# Patient Record
Sex: Female | Born: 1945 | Race: White | Hispanic: No | Marital: Married | Smoking: Former smoker
Health system: Southern US, Community
[De-identification: ages and names within clinical notes are randomized; demographics above are authoritative.]

## PROBLEM LIST (undated history)

## (undated) DIAGNOSIS — I340 Nonrheumatic mitral (valve) insufficiency: Secondary | ICD-10-CM

## (undated) DIAGNOSIS — M545 Low back pain, unspecified: Secondary | ICD-10-CM

## (undated) DIAGNOSIS — E079 Disorder of thyroid, unspecified: Secondary | ICD-10-CM

## (undated) DIAGNOSIS — K219 Gastro-esophageal reflux disease without esophagitis: Secondary | ICD-10-CM

## (undated) DIAGNOSIS — M1990 Unspecified osteoarthritis, unspecified site: Secondary | ICD-10-CM

## (undated) DIAGNOSIS — M419 Scoliosis, unspecified: Secondary | ICD-10-CM

## (undated) HISTORY — DX: Gastro-esophageal reflux disease without esophagitis: K21.9

## (undated) HISTORY — DX: Scoliosis, unspecified: M41.9

## (undated) HISTORY — DX: Unspecified osteoarthritis, unspecified site: M19.90

## (undated) HISTORY — DX: Low back pain: M54.5

## (undated) HISTORY — DX: Nonrheumatic mitral (valve) insufficiency: I34.0

## (undated) HISTORY — PX: ABDOMINAL HYSTERECTOMY: SHX81

## (undated) HISTORY — DX: Disorder of thyroid, unspecified: E07.9

## (undated) HISTORY — DX: Low back pain, unspecified: M54.50

---

## 1997-10-29 ENCOUNTER — Ambulatory Visit (HOSPITAL_COMMUNITY): Admission: RE | Admit: 1997-10-29 | Discharge: 1997-10-29 | Payer: Self-pay

## 1999-03-06 ENCOUNTER — Ambulatory Visit (HOSPITAL_COMMUNITY): Admission: RE | Admit: 1999-03-06 | Discharge: 1999-03-06 | Payer: Self-pay | Admitting: *Deleted

## 1999-03-13 ENCOUNTER — Ambulatory Visit (HOSPITAL_COMMUNITY): Admission: RE | Admit: 1999-03-13 | Discharge: 1999-03-13 | Payer: Self-pay | Admitting: *Deleted

## 1999-10-16 ENCOUNTER — Ambulatory Visit (HOSPITAL_COMMUNITY): Admission: RE | Admit: 1999-10-16 | Discharge: 1999-10-16 | Payer: Self-pay | Admitting: *Deleted

## 2000-10-18 ENCOUNTER — Ambulatory Visit (HOSPITAL_COMMUNITY): Admission: RE | Admit: 2000-10-18 | Discharge: 2000-10-18 | Payer: Self-pay | Admitting: *Deleted

## 2001-10-25 ENCOUNTER — Ambulatory Visit (HOSPITAL_COMMUNITY): Admission: RE | Admit: 2001-10-25 | Discharge: 2001-10-25 | Payer: Self-pay | Admitting: *Deleted

## 2002-08-10 HISTORY — PX: OTHER SURGICAL HISTORY: SHX169

## 2002-10-31 ENCOUNTER — Ambulatory Visit (HOSPITAL_COMMUNITY): Admission: RE | Admit: 2002-10-31 | Discharge: 2002-10-31 | Payer: Self-pay | Admitting: Internal Medicine

## 2002-10-31 ENCOUNTER — Encounter: Payer: Self-pay | Admitting: Internal Medicine

## 2003-02-13 ENCOUNTER — Other Ambulatory Visit: Admission: RE | Admit: 2003-02-13 | Discharge: 2003-02-13 | Payer: Self-pay | Admitting: Internal Medicine

## 2003-02-16 ENCOUNTER — Encounter: Admission: RE | Admit: 2003-02-16 | Discharge: 2003-02-16 | Payer: Self-pay | Admitting: Internal Medicine

## 2003-02-16 ENCOUNTER — Encounter: Payer: Self-pay | Admitting: Internal Medicine

## 2003-11-06 ENCOUNTER — Ambulatory Visit (HOSPITAL_COMMUNITY): Admission: RE | Admit: 2003-11-06 | Discharge: 2003-11-06 | Payer: Self-pay | Admitting: Internal Medicine

## 2004-04-29 ENCOUNTER — Other Ambulatory Visit: Admission: RE | Admit: 2004-04-29 | Discharge: 2004-04-29 | Payer: Self-pay | Admitting: Internal Medicine

## 2004-11-18 ENCOUNTER — Ambulatory Visit (HOSPITAL_COMMUNITY): Admission: RE | Admit: 2004-11-18 | Discharge: 2004-11-18 | Payer: Self-pay | Admitting: Internal Medicine

## 2005-08-06 ENCOUNTER — Encounter: Admission: RE | Admit: 2005-08-06 | Discharge: 2005-08-06 | Payer: Self-pay | Admitting: Internal Medicine

## 2005-08-06 LAB — HM DEXA SCAN

## 2005-08-07 ENCOUNTER — Encounter: Admission: RE | Admit: 2005-08-07 | Discharge: 2005-08-07 | Payer: Self-pay | Admitting: Internal Medicine

## 2005-09-02 ENCOUNTER — Ambulatory Visit: Payer: Self-pay | Admitting: Pulmonary Disease

## 2005-10-27 ENCOUNTER — Ambulatory Visit: Payer: Self-pay | Admitting: Internal Medicine

## 2005-11-18 ENCOUNTER — Ambulatory Visit: Payer: Self-pay | Admitting: Internal Medicine

## 2005-11-18 ENCOUNTER — Ambulatory Visit (HOSPITAL_COMMUNITY): Admission: RE | Admit: 2005-11-18 | Discharge: 2005-11-18 | Payer: Self-pay | Admitting: Internal Medicine

## 2005-11-19 ENCOUNTER — Ambulatory Visit (HOSPITAL_COMMUNITY): Admission: RE | Admit: 2005-11-19 | Discharge: 2005-11-19 | Payer: Self-pay | Admitting: Internal Medicine

## 2006-05-25 ENCOUNTER — Encounter: Admission: RE | Admit: 2006-05-25 | Discharge: 2006-05-25 | Payer: Self-pay | Admitting: Internal Medicine

## 2006-11-23 ENCOUNTER — Ambulatory Visit (HOSPITAL_COMMUNITY): Admission: RE | Admit: 2006-11-23 | Discharge: 2006-11-23 | Payer: Self-pay | Admitting: Internal Medicine

## 2007-06-17 ENCOUNTER — Other Ambulatory Visit: Admission: RE | Admit: 2007-06-17 | Discharge: 2007-06-17 | Payer: Self-pay | Admitting: Internal Medicine

## 2007-06-17 LAB — HM PAP SMEAR

## 2007-06-21 ENCOUNTER — Encounter: Admission: RE | Admit: 2007-06-21 | Discharge: 2007-06-21 | Payer: Self-pay | Admitting: Internal Medicine

## 2007-11-29 ENCOUNTER — Ambulatory Visit (HOSPITAL_COMMUNITY): Admission: RE | Admit: 2007-11-29 | Discharge: 2007-11-29 | Payer: Self-pay | Admitting: Internal Medicine

## 2008-06-22 ENCOUNTER — Ambulatory Visit: Payer: Self-pay | Admitting: Internal Medicine

## 2008-12-05 ENCOUNTER — Ambulatory Visit (HOSPITAL_COMMUNITY): Admission: RE | Admit: 2008-12-05 | Discharge: 2008-12-05 | Payer: Self-pay | Admitting: Internal Medicine

## 2009-03-26 IMAGING — CT CT CHEST W/O CM
3 of 4 series · 17 of 30 positions shown, 19 images · IV contrast (agent unspecified)
Comparison: 05/25/06 CT.

CLINICAL DATA: Followup pulmonary nodules noted on CT scan on 05/25/06. 
 CHEST CT WITHOUT CONTRAST:
TECHNIQUE: Multidetector CT imaging of the chest was performed following the standard protocol without IV contrast.

[Series 2: routine chest · axial · 0.70mm/px · z∈[-277,-67]mm · 5 of 72 slices shown, 7 images]
[im 15/72  mediastinal]
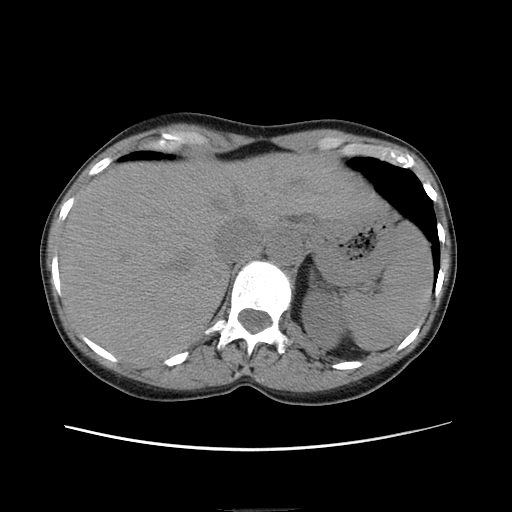
[im 15/72  lung]
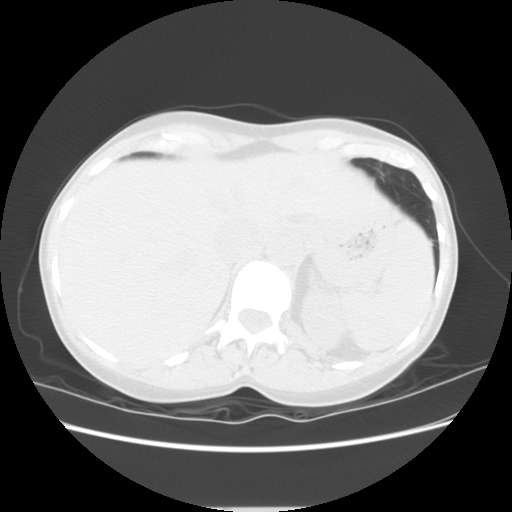
[im 29/72  lung]
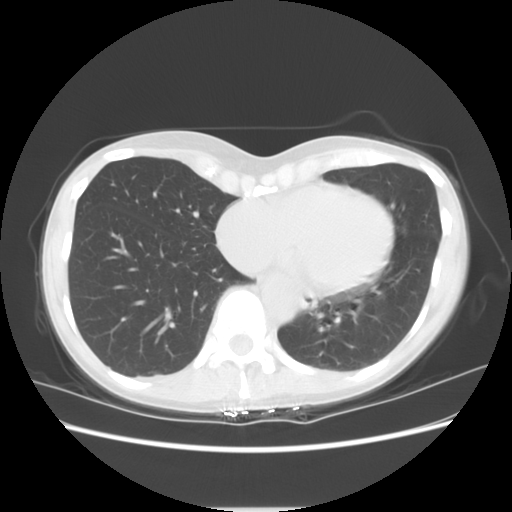
[im 35/72  lung]
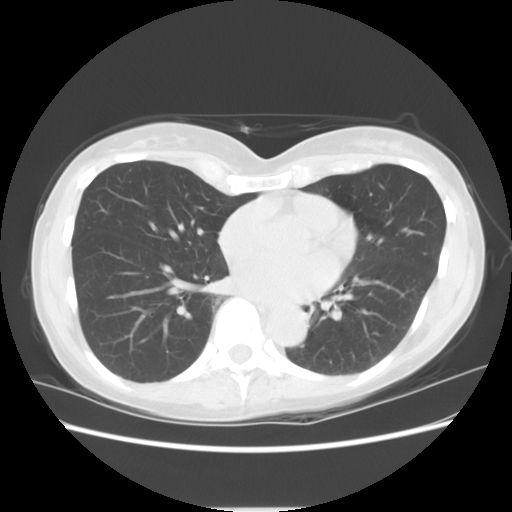
[im 43/72  lung]
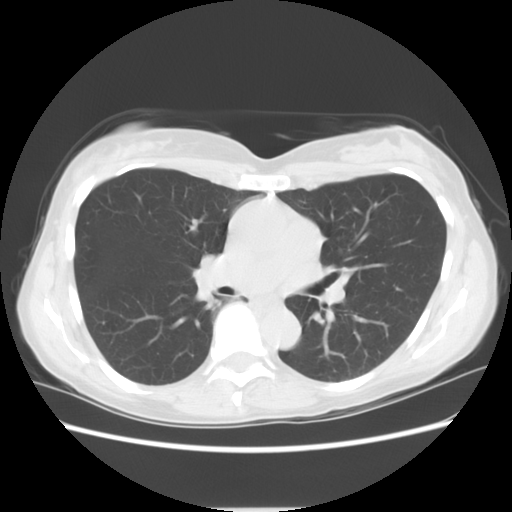
[im 57/72  mediastinal]
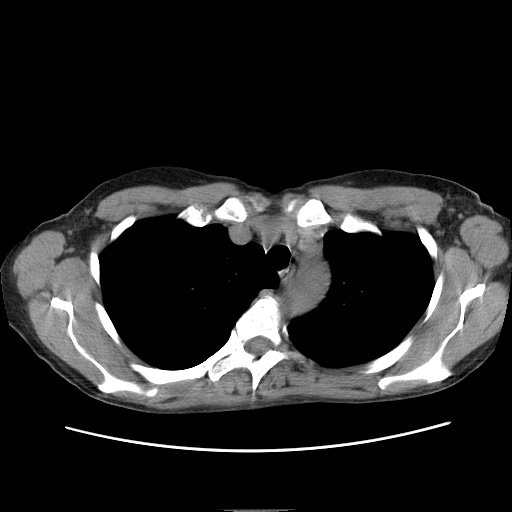
[im 57/72  lung]
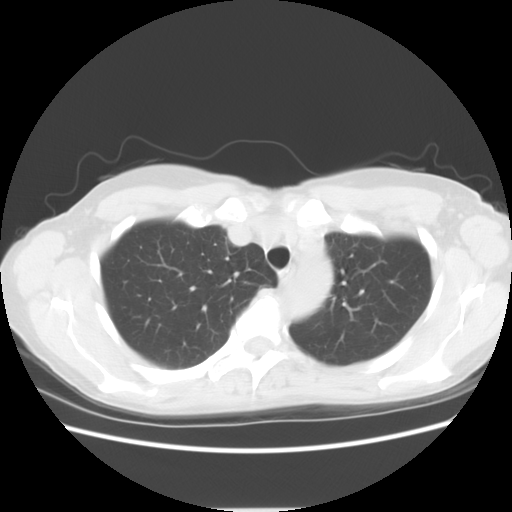

[Series 3: lung windows · axial · 0.70mm/px · z∈[-232,-72]mm · 4 of 64 slices shown]
[im 16/64  lung]
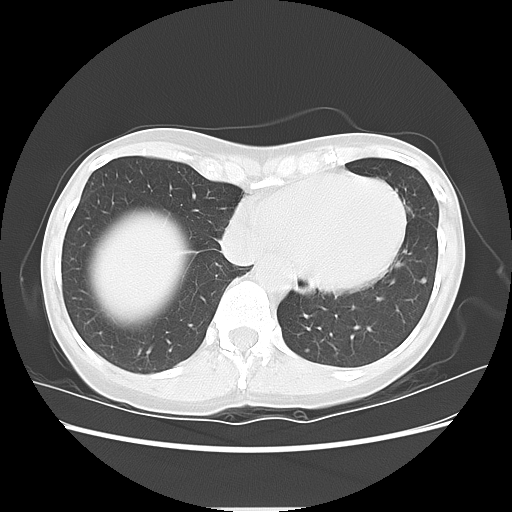
[im 27/64  lung]
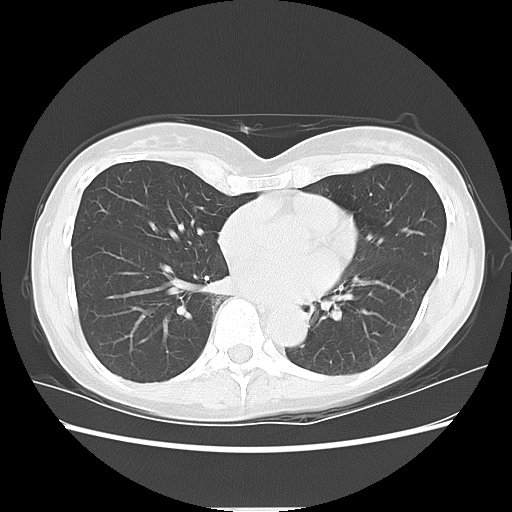
[im 32/64  lung]
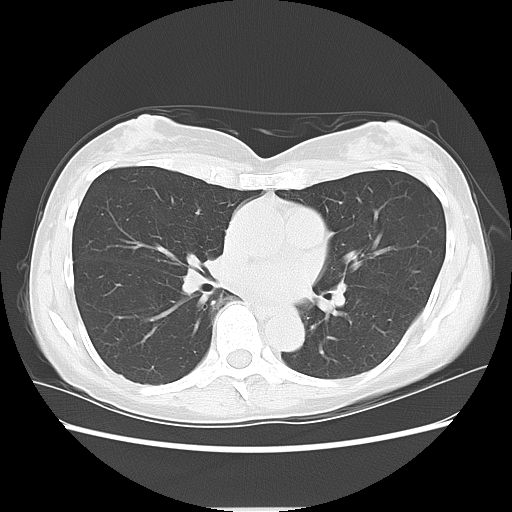
[im 48/64  lung]
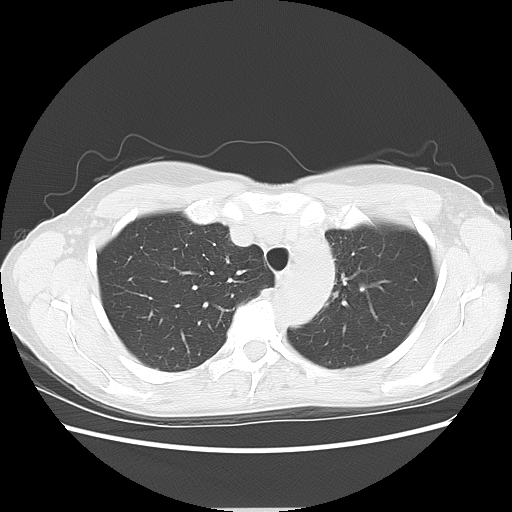

[Series 401: sagittal · sagittal · 0.70mm/px · 8 of 121 slices shown]
[im 14/121  lung]
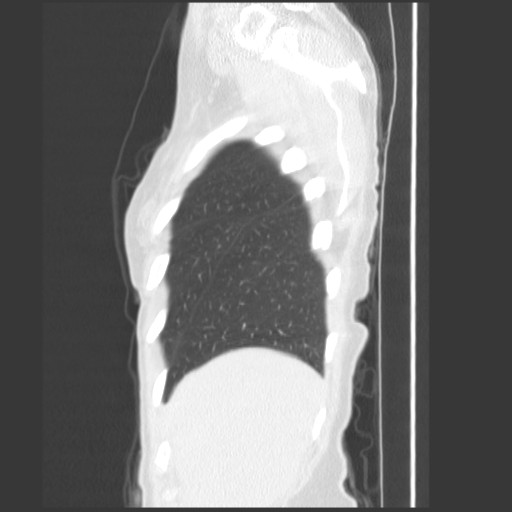
[im 27/121  lung]
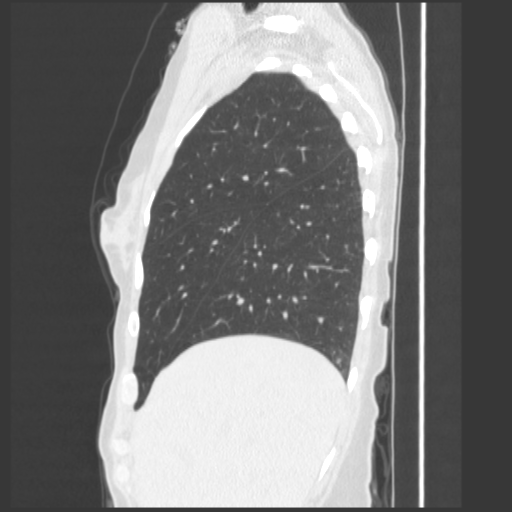
[im 41/121  lung]
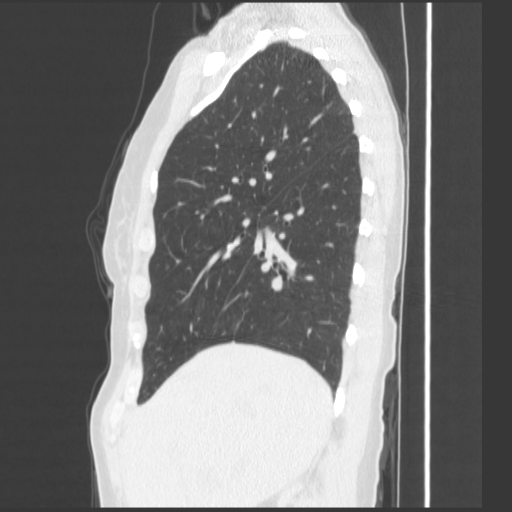
[im 54/121  lung]
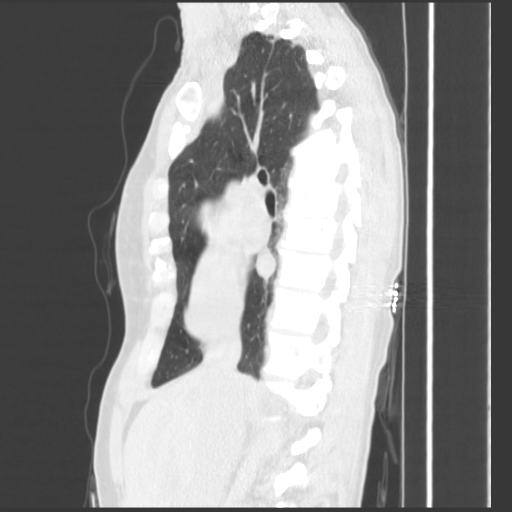
[im 67/121  lung]
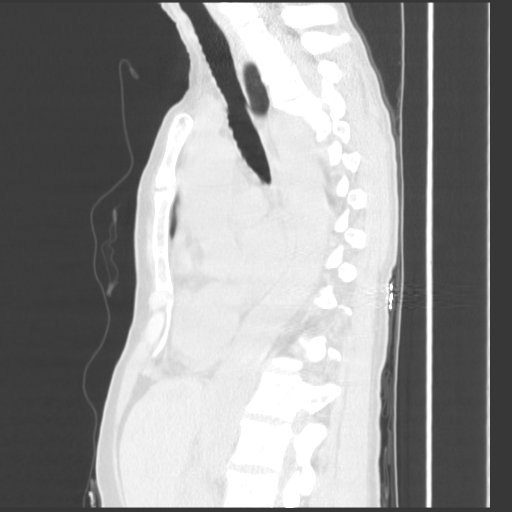
[im 81/121  lung]
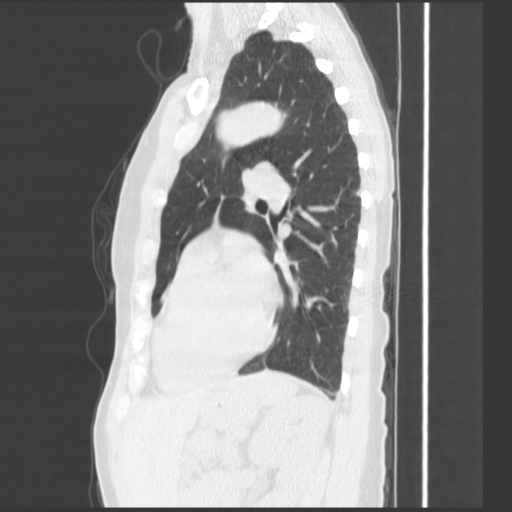
[im 94/121  lung]
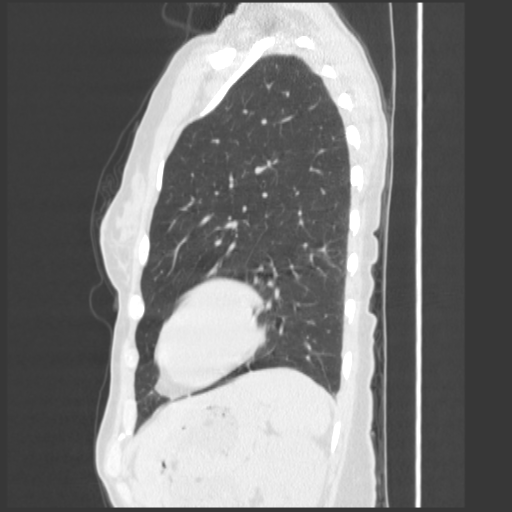
[im 107/121  lung]
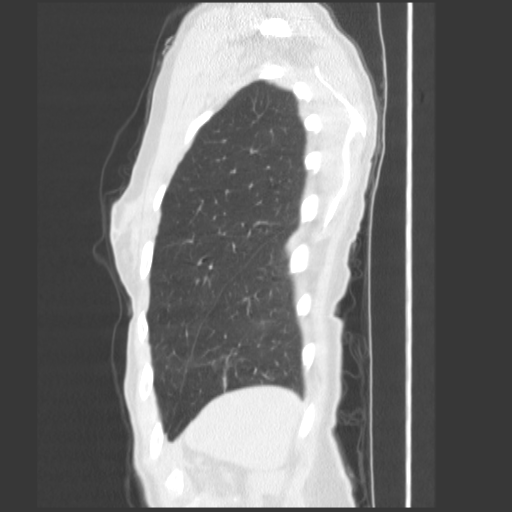

[17 of 30 positions shown; findings below may reference images not displayed]

FINDINGS: Stable noncalcified 4.1 mm nodule in the right upper lobe (image 24) and 4.5 mm nodule in the left lower lobe (image 48).  No enlarged mediastinal or hilar lymph nodes.  Incidental pectus excavatum deformity of the chest.
IMPRESSION: Stable right upper and left lower lobar nodules.  This represents a followup CT at approximately 1 year.  Given the stability of the nodules, I do not feel that further imaging followup is necessary if this is a low risk patient, i.e., minimal or absent history of smoking or other known risk factors.  If this is a high risk patient, i.e., history of smoking or other known risk factors then I would recommend a followup CT at 18-24 months.

## 2009-08-13 ENCOUNTER — Ambulatory Visit: Payer: Self-pay | Admitting: Internal Medicine

## 2009-09-15 ENCOUNTER — Ambulatory Visit: Payer: Self-pay | Admitting: Internal Medicine

## 2009-12-05 ENCOUNTER — Ambulatory Visit (HOSPITAL_COMMUNITY): Admission: RE | Admit: 2009-12-05 | Discharge: 2009-12-05 | Payer: Self-pay | Admitting: Internal Medicine

## 2009-12-05 LAB — HM MAMMOGRAPHY

## 2010-08-21 ENCOUNTER — Ambulatory Visit
Admission: RE | Admit: 2010-08-21 | Discharge: 2010-08-21 | Payer: Self-pay | Source: Home / Self Care | Attending: Internal Medicine | Admitting: Internal Medicine

## 2010-09-18 ENCOUNTER — Other Ambulatory Visit: Payer: Self-pay | Admitting: Internal Medicine

## 2010-12-05 ENCOUNTER — Ambulatory Visit (INDEPENDENT_AMBULATORY_CARE_PROVIDER_SITE_OTHER): Payer: BC Managed Care – PPO | Admitting: Internal Medicine

## 2010-12-05 DIAGNOSIS — E039 Hypothyroidism, unspecified: Secondary | ICD-10-CM

## 2010-12-08 ENCOUNTER — Other Ambulatory Visit: Payer: Self-pay | Admitting: Internal Medicine

## 2010-12-08 DIAGNOSIS — Z1231 Encounter for screening mammogram for malignant neoplasm of breast: Secondary | ICD-10-CM

## 2011-01-08 ENCOUNTER — Ambulatory Visit (HOSPITAL_COMMUNITY)
Admission: RE | Admit: 2011-01-08 | Discharge: 2011-01-08 | Disposition: A | Discharge status: Home / Self Care | Payer: BC Managed Care – PPO | Source: Ambulatory Visit | Attending: Internal Medicine | Admitting: Internal Medicine

## 2011-01-08 DIAGNOSIS — Z1231 Encounter for screening mammogram for malignant neoplasm of breast: Secondary | ICD-10-CM | POA: Insufficient documentation

## 2011-08-18 ENCOUNTER — Other Ambulatory Visit: Payer: BC Managed Care – PPO | Admitting: Internal Medicine

## 2011-08-18 DIAGNOSIS — Z0000 Encounter for general adult medical examination without abnormal findings: Secondary | ICD-10-CM

## 2011-08-18 DIAGNOSIS — E039 Hypothyroidism, unspecified: Secondary | ICD-10-CM

## 2011-08-18 LAB — CBC WITH DIFFERENTIAL/PLATELET
Eosinophils Absolute: 0.2 10*3/uL (ref 0.0–0.7)
Eosinophils Relative: 5 % (ref 0–5)
HCT: 41.2 % (ref 36.0–46.0)
Hemoglobin: 14.3 g/dL (ref 12.0–15.0)
Lymphocytes Relative: 50 % — ABNORMAL HIGH (ref 12–46)
Lymphs Abs: 2 10*3/uL (ref 0.7–4.0)
MCH: 33.6 pg (ref 26.0–34.0)
MCV: 96.9 fL (ref 78.0–100.0)
Monocytes Relative: 11 % (ref 3–12)
Platelets: 218 10*3/uL (ref 150–400)
RBC: 4.25 MIL/uL (ref 3.87–5.11)
WBC: 4.1 10*3/uL (ref 4.0–10.5)

## 2011-08-18 LAB — COMPREHENSIVE METABOLIC PANEL
ALT: 14 U/L (ref 0–35)
CO2: 26 mEq/L (ref 19–32)
Calcium: 9.8 mg/dL (ref 8.4–10.5)
Chloride: 105 mEq/L (ref 96–112)
Creat: 0.95 mg/dL (ref 0.50–1.10)
Glucose, Bld: 105 mg/dL — ABNORMAL HIGH (ref 70–99)
Total Bilirubin: 0.6 mg/dL (ref 0.3–1.2)
Total Protein: 6.7 g/dL (ref 6.0–8.3)

## 2011-08-18 LAB — LIPID PANEL
Cholesterol: 167 mg/dL (ref 0–200)
Total CHOL/HDL Ratio: 1.7 Ratio
Triglycerides: 50 mg/dL (ref <150)

## 2011-08-25 ENCOUNTER — Encounter: Payer: Self-pay | Admitting: Internal Medicine

## 2011-08-25 ENCOUNTER — Ambulatory Visit (INDEPENDENT_AMBULATORY_CARE_PROVIDER_SITE_OTHER): Payer: BC Managed Care – PPO | Admitting: Internal Medicine

## 2011-08-25 VITALS — BP 110/76 | HR 80 | Temp 97.6°F | Ht 67.5 in | Wt 153.0 lb

## 2011-08-25 DIAGNOSIS — I340 Nonrheumatic mitral (valve) insufficiency: Secondary | ICD-10-CM

## 2011-08-25 DIAGNOSIS — M19042 Primary osteoarthritis, left hand: Secondary | ICD-10-CM

## 2011-08-25 DIAGNOSIS — Z131 Encounter for screening for diabetes mellitus: Secondary | ICD-10-CM

## 2011-08-25 DIAGNOSIS — Z8739 Personal history of other diseases of the musculoskeletal system and connective tissue: Secondary | ICD-10-CM

## 2011-08-25 DIAGNOSIS — Z0000 Encounter for general adult medical examination without abnormal findings: Secondary | ICD-10-CM

## 2011-08-25 DIAGNOSIS — Z23 Encounter for immunization: Secondary | ICD-10-CM

## 2011-08-25 DIAGNOSIS — M419 Scoliosis, unspecified: Secondary | ICD-10-CM

## 2011-08-25 DIAGNOSIS — E039 Hypothyroidism, unspecified: Secondary | ICD-10-CM

## 2011-08-25 DIAGNOSIS — D3614 Benign neoplasm of peripheral nerves and autonomic nervous system of thorax: Secondary | ICD-10-CM

## 2011-08-25 DIAGNOSIS — M19041 Primary osteoarthritis, right hand: Secondary | ICD-10-CM

## 2011-08-25 DIAGNOSIS — K219 Gastro-esophageal reflux disease without esophagitis: Secondary | ICD-10-CM

## 2011-08-25 LAB — POCT URINALYSIS DIPSTICK
Leukocytes, UA: NEGATIVE
Nitrite, UA: NEGATIVE
Protein, UA: NEGATIVE
pH, UA: 6

## 2011-08-25 LAB — HEMOGLOBIN A1C: Hgb A1c MFr Bld: 5.6 % (ref <5.7)

## 2011-08-26 MED ORDER — ESTROGENS CONJUGATED 0.625 MG PO TABS: 0.6250 mg | ORAL_TABLET | Freq: Every day | ORAL | Status: DC

## 2011-08-26 MED ORDER — OMEPRAZOLE 20 MG PO CPDR: 20.0000 mg | DELAYED_RELEASE_CAPSULE | Freq: Every day | ORAL | Status: AC

## 2011-08-26 MED ORDER — NAPROXEN 500 MG PO TABS: 500.0000 mg | ORAL_TABLET | Freq: Two times a day (BID) | ORAL | Status: AC

## 2011-08-26 MED ORDER — LEVOTHYROXINE SODIUM 50 MCG PO TABS: 50.0000 ug | ORAL_TABLET | Freq: Every day | ORAL | Status: AC

## 2011-09-21 ENCOUNTER — Telehealth: Payer: Self-pay | Admitting: Internal Medicine

## 2011-09-21 MED ORDER — ESTROGENS CONJUGATED 0.625 MG PO TABS: 0.6250 mg | ORAL_TABLET | Freq: Every day | ORAL | Status: AC

## 2011-09-21 NOTE — Telephone Encounter (Signed)
Pt is on daily Premarin. Please call in #[phone] one po daily with prn one year refills.

## 2011-09-28 ENCOUNTER — Other Ambulatory Visit: Payer: Self-pay | Admitting: Internal Medicine

## 2011-11-09 ENCOUNTER — Encounter: Payer: Self-pay | Admitting: Internal Medicine

## 2011-11-09 DIAGNOSIS — I340 Nonrheumatic mitral (valve) insufficiency: Secondary | ICD-10-CM | POA: Insufficient documentation

## 2011-11-09 DIAGNOSIS — M419 Scoliosis, unspecified: Secondary | ICD-10-CM | POA: Insufficient documentation

## 2011-11-09 DIAGNOSIS — M19042 Primary osteoarthritis, left hand: Secondary | ICD-10-CM | POA: Insufficient documentation

## 2011-11-09 DIAGNOSIS — Z8739 Personal history of other diseases of the musculoskeletal system and connective tissue: Secondary | ICD-10-CM | POA: Insufficient documentation

## 2011-11-09 DIAGNOSIS — M19041 Primary osteoarthritis, right hand: Secondary | ICD-10-CM | POA: Insufficient documentation

## 2011-11-09 DIAGNOSIS — K219 Gastro-esophageal reflux disease without esophagitis: Secondary | ICD-10-CM | POA: Insufficient documentation

## 2011-11-09 NOTE — Progress Notes (Signed)
  Subjective:    Patient ID: Alexis Gates, female    DOB: Mar 20, 1946, 66 y.o.   MRN: [id number]  HPI 66 year old white female in today for "welcome to Medicare "exam.  History of GE reflux, low back pain, scoliosis, osteoarthritis of the hands, mild mitral regurgitation, hypothyroidism diagnosed in January 2012. 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 Hysterectomy and BSO in 1988. She is allergic to penicillin. Had colonoscopy by Dr. Juanda Chance 2007 with repeat study recommended in 10 years. Had Zostavax vaccine January 2012. Declines influenza immunization. Had tetanus immunization August 2004. Last Pap was in 2008. No need to repeat with history of hysterectomy.  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 She is married. Has a 4 year college degree. Does not smoke.  Social alcohol consumption. No children. 2 brothers and one sister in good health. Father with history of Alzheimer's disease.  Patient saw a rheumatologist, Dr. Dareen Piano in 2011 and was diagnosed with erosive osteoarthritis of the hands for which he suggested Naprosyn twice daily.  In February 2012 was diagnosed with hypothyroidism.  Patient had influenza in 2011. Doesn't like to take influenza vaccine. Did have flu vaccine January 2012.    Review of Systems  Constitutional: Negative.   HENT: Negative.   Eyes: Negative.   Respiratory: Negative.   Cardiovascular: Negative.   Gastrointestinal:       Reflux  Genitourinary: Negative.   Musculoskeletal:       Osteoarthritis of hands, history of low back pain, scoliosis  Neurological: Negative.   Hematological: Negative.   Psychiatric/Behavioral: Negative.        Objective:   Physical Exam  Constitutional: She is oriented to person, place, and time. She appears well-developed and well-nourished.  HENT:  Head: Normocephalic.  Right Ear: External ear normal.  Eyes: Conjunctivae and EOM are normal.  Neck: Neck supple. No JVD present. No  thyromegaly present.  Cardiovascular: Normal rate, regular rhythm, normal heart sounds and intact distal pulses.        1/6 systolic ejection murmur  Pulmonary/Chest: Effort normal and breath sounds normal. No respiratory distress. She has no wheezes. She has no rales.  Abdominal: Bowel sounds are normal. She exhibits no distension and no mass. There is no tenderness. There is no rebound and no guarding.  Genitourinary:       Deferred  Musculoskeletal: Normal range of motion. She exhibits no edema.       Bilateral Heberden's and Bouchard's nodes  Lymphadenopathy:    She has no cervical adenopathy.  Neurological: She is alert and oriented to person, place, and time. She has normal reflexes. No cranial nerve deficit. Coordination normal.  Skin: Skin is warm and dry. No rash noted.  Psychiatric: She has a normal mood and affect. Her behavior is normal. Judgment and thought content normal.          Assessment & Plan:  GE reflux  History of low back pain.  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

## 2011-12-07 ENCOUNTER — Other Ambulatory Visit: Payer: Self-pay | Admitting: Internal Medicine

## 2011-12-07 DIAGNOSIS — Z1231 Encounter for screening mammogram for malignant neoplasm of breast: Secondary | ICD-10-CM

## 2012-01-21 ENCOUNTER — Ambulatory Visit (HOSPITAL_COMMUNITY)
Admission: RE | Admit: 2012-01-21 | Discharge: 2012-01-21 | Disposition: A | Discharge status: Home / Self Care | Payer: Medicare Other | Source: Ambulatory Visit | Attending: Internal Medicine | Admitting: Internal Medicine

## 2012-01-21 DIAGNOSIS — Z1231 Encounter for screening mammogram for malignant neoplasm of breast: Secondary | ICD-10-CM | POA: Insufficient documentation

## 2012-03-31 ENCOUNTER — Encounter: Payer: Self-pay | Admitting: Internal Medicine

## 2012-03-31 ENCOUNTER — Ambulatory Visit (INDEPENDENT_AMBULATORY_CARE_PROVIDER_SITE_OTHER): Payer: Medicare Other | Admitting: Internal Medicine

## 2012-03-31 VITALS — BP 112/84 | HR 68 | Temp 97.3°F | Ht 67.5 in | Wt 151.0 lb

## 2012-03-31 DIAGNOSIS — R6889 Other general symptoms and signs: Secondary | ICD-10-CM

## 2012-03-31 DIAGNOSIS — E039 Hypothyroidism, unspecified: Secondary | ICD-10-CM

## 2012-03-31 DIAGNOSIS — H02539 Eyelid retraction unspecified eye, unspecified lid: Secondary | ICD-10-CM

## 2012-03-31 DIAGNOSIS — R5383 Other fatigue: Secondary | ICD-10-CM

## 2012-03-31 DIAGNOSIS — Z8619 Personal history of other infectious and parasitic diseases: Secondary | ICD-10-CM

## 2012-03-31 DIAGNOSIS — R5381 Other malaise: Secondary | ICD-10-CM

## 2012-03-31 LAB — COMPREHENSIVE METABOLIC PANEL
ALT: 13 U/L (ref 0–35)
AST: 14 U/L (ref 0–37)
Albumin: 4.1 g/dL (ref 3.5–5.2)
Alkaline Phosphatase: 65 U/L (ref 39–117)
BUN: 16 mg/dL (ref 6–23)
Calcium: 9.4 mg/dL (ref 8.4–10.5)
Chloride: 103 mEq/L (ref 96–112)
Creat: 0.82 mg/dL (ref 0.50–1.10)
Potassium: 4.5 mEq/L (ref 3.5–5.3)

## 2012-03-31 LAB — CBC WITH DIFFERENTIAL/PLATELET
Basophils Absolute: 0.1 10*3/uL (ref 0.0–0.1)
Basophils Relative: 1 % (ref 0–1)
Lymphocytes Relative: 39 % (ref 12–46)
MCHC: 34.8 g/dL (ref 30.0–36.0)
Neutro Abs: 3.1 10*3/uL (ref 1.7–7.7)
Neutrophils Relative %: 49 % (ref 43–77)
Platelets: 226 10*3/uL (ref 150–400)
RDW: 14.7 % (ref 11.5–15.5)
WBC: 6.3 10*3/uL (ref 4.0–10.5)

## 2012-03-31 NOTE — Patient Instructions (Addendum)
Await lab results. You may want to see cardiologist regarding persistent shortness of breath symptoms.

## 2012-03-31 NOTE — Progress Notes (Signed)
  Subjective:    Patient ID: Alexis Gates, female    DOB: 25-Nov-1945, 66 y.o.   MRN: [id number]  HPI 66 year old white female currently living in [place] where she and her husband are retired. She's here today because she's had a recent apparent tickborne illness. Doesn't bring in lab results for me to review today but I have requested that she send them to me. Has been treated with doxycycline for nearly 21 days. Was told she had Lyme disease. She had tick bite on right shoulder subsequently developed headache chills malaise and fatigue. She has a history of hypothyroidism and is on thyroid replacement 0.05 mg daily. Sometimes when she squats down for while working in the garden and stands up rather suddenly she feels a bit dizzy. She is considering having a blepharoplasty and wants name of Engineer, petroleum.  Also has noticed some shortness of breath going up some hills. [place] been living there for couple of years and doesn't think she should be having this decreased exercise tolerance. There is no chest pain. No leg pain. She may want to see cardiologist is symptoms do not improve. She is requesting that we check her thyroid. TSH several months ago was approximately 3 on Synthroid 0.05 mg daily. Pulse oximetry on room air is 99%. She has a history of mild mitral regurgitation  Apparently area where tick bit her became infected because it was red enlarged and hot to touch.    Review of Systems     Objective:   Physical Exam neck is supple without thyromegaly or carotid bruits; chest clear to auscultation; cardiac exam regular rate and rhythm normal S1 and S2; extremities without edema; skin warm and dry with no evidence of tick bite. Slight lid lag bilaterally.        Assessment & Plan:  Recent tickborne illness  Hypothyroidism  Decreased exercise tolerance  Fatigue  Plan: CBC with differential, C. met, free T4, TSH. Pulse  oximetry is normal. We'll communicate results to patient tomorrow.

## 2012-09-05 ENCOUNTER — Encounter: Payer: Medicare Other | Admitting: Internal Medicine

## 2012-09-05 ENCOUNTER — Other Ambulatory Visit: Payer: Medicare Other | Admitting: Internal Medicine

## 2015-10-01 ENCOUNTER — Encounter: Payer: Self-pay | Admitting: Gastroenterology

## 2015-10-15 ENCOUNTER — Encounter: Payer: Self-pay | Admitting: Gastroenterology

## 2015-10-24 ENCOUNTER — Telehealth: Payer: Self-pay | Admitting: Gastroenterology

## 2015-10-24 NOTE — Telephone Encounter (Signed)
3/16 patient called to let [facility] know that has gone elsewhere for her GI care.

## 2018-02-07 DEATH — deceased
